# Patient Record
Sex: Male | Born: 2000 | Race: White | Hispanic: No | Marital: Single | Smoking: Never smoker
Health system: Southern US, Community
[De-identification: ages and names within clinical notes are randomized; demographics above are authoritative.]

## PROBLEM LIST (undated history)

## (undated) DIAGNOSIS — Z9109 Other allergy status, other than to drugs and biological substances: Secondary | ICD-10-CM

## (undated) DIAGNOSIS — J45909 Unspecified asthma, uncomplicated: Secondary | ICD-10-CM

## (undated) DIAGNOSIS — F419 Anxiety disorder, unspecified: Secondary | ICD-10-CM

---
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
## 2020-01-28 ENCOUNTER — Emergency Department (HOSPITAL_COMMUNITY)
Admission: EM | Admit: 2020-01-28 | Discharge: 2020-01-28 | Disposition: A | Discharge status: Home / Self Care | Payer: Medicaid Other | Attending: General Surgery | Admitting: General Surgery

## 2020-01-28 ENCOUNTER — Emergency Department (HOSPITAL_COMMUNITY): Payer: Medicaid Other | Admitting: Certified Registered"

## 2020-01-28 ENCOUNTER — Emergency Department (HOSPITAL_COMMUNITY): Payer: Medicaid Other

## 2020-01-28 ENCOUNTER — Encounter (HOSPITAL_COMMUNITY)
Admission: EM | Disposition: A | Discharge status: Home / Self Care | Payer: Self-pay | Source: Home / Self Care | Attending: Emergency Medicine

## 2020-01-28 DIAGNOSIS — W3400XA Accidental discharge from unspecified firearms or gun, initial encounter: Secondary | ICD-10-CM | POA: Diagnosis not present

## 2020-01-28 DIAGNOSIS — S0993XA Unspecified injury of face, initial encounter: Secondary | ICD-10-CM | POA: Diagnosis present

## 2020-01-28 DIAGNOSIS — S0242XB Fracture of alveolus of maxilla, initial encounter for open fracture: Secondary | ICD-10-CM | POA: Diagnosis not present

## 2020-01-28 DIAGNOSIS — S1194XA Puncture wound with foreign body of unspecified part of neck, initial encounter: Secondary | ICD-10-CM | POA: Diagnosis not present

## 2020-01-28 DIAGNOSIS — Z930 Tracheostomy status: Secondary | ICD-10-CM

## 2020-01-28 DIAGNOSIS — Z23 Encounter for immunization: Secondary | ICD-10-CM | POA: Diagnosis not present

## 2020-01-28 DIAGNOSIS — Z20822 Contact with and (suspected) exposure to covid-19: Secondary | ICD-10-CM | POA: Diagnosis not present

## 2020-01-28 DIAGNOSIS — T1490XA Injury, unspecified, initial encounter: Secondary | ICD-10-CM

## 2020-01-28 DIAGNOSIS — S022XXB Fracture of nasal bones, initial encounter for open fracture: Secondary | ICD-10-CM | POA: Insufficient documentation

## 2020-01-28 DIAGNOSIS — Z68.41 Body mass index (BMI) pediatric, greater than or equal to 95th percentile for age: Secondary | ICD-10-CM | POA: Insufficient documentation

## 2020-01-28 DIAGNOSIS — Y999 Unspecified external cause status: Secondary | ICD-10-CM | POA: Diagnosis not present

## 2020-01-28 DIAGNOSIS — S0266XB Fracture of symphysis of mandible, initial encounter for open fracture: Secondary | ICD-10-CM | POA: Diagnosis not present

## 2020-01-28 DIAGNOSIS — E669 Obesity, unspecified: Secondary | ICD-10-CM | POA: Diagnosis not present

## 2020-01-28 HISTORY — PX: TRACHEOSTOMY TUBE PLACEMENT: SHX814

## 2020-01-28 LAB — CBC
HCT: 44.8 % (ref 39.0–52.0)
Hemoglobin: 14.2 g/dL (ref 13.0–17.0)
MCH: 28.6 pg (ref 26.0–34.0)
MCHC: 31.7 g/dL (ref 30.0–36.0)
MCV: 90.1 fL (ref 80.0–100.0)
Platelets: 329 10*3/uL (ref 150–400)
RBC: 4.97 MIL/uL (ref 4.22–5.81)
RDW: 12.9 % (ref 11.5–15.5)
WBC: 20.6 10*3/uL — ABNORMAL HIGH (ref 4.0–10.5)
nRBC: 0 % (ref 0.0–0.2)

## 2020-01-28 LAB — I-STAT CHEM 8, ED
BUN: 14 mg/dL (ref 6–20)
Calcium, Ion: 1.04 mmol/L — ABNORMAL LOW (ref 1.15–1.40)
Chloride: 102 mmol/L (ref 98–111)
Creatinine, Ser: 0.8 mg/dL (ref 0.61–1.24)
Glucose, Bld: 163 mg/dL — ABNORMAL HIGH (ref 70–99)
HCT: 44 % (ref 39.0–52.0)
Hemoglobin: 15 g/dL (ref 13.0–17.0)
Potassium: 3.2 mmol/L — ABNORMAL LOW (ref 3.5–5.1)
Sodium: 138 mmol/L (ref 135–145)
TCO2: 22 mmol/L (ref 22–32)

## 2020-01-28 LAB — TYPE AND SCREEN
ABO/RH(D): A POS
Antibody Screen: NEGATIVE

## 2020-01-28 LAB — LACTIC ACID, PLASMA: Lactic Acid, Venous: 1.8 mmol/L (ref 0.5–1.9)

## 2020-01-28 LAB — COMPREHENSIVE METABOLIC PANEL
ALT: 34 U/L (ref 0–44)
AST: 28 U/L (ref 15–41)
Albumin: 4.5 g/dL (ref 3.5–5.0)
Alkaline Phosphatase: 83 U/L (ref 38–126)
Anion gap: 14 (ref 5–15)
BUN: 12 mg/dL (ref 6–20)
CO2: 22 mmol/L (ref 22–32)
Calcium: 9.2 mg/dL (ref 8.9–10.3)
Chloride: 99 mmol/L (ref 98–111)
Creatinine, Ser: 0.87 mg/dL (ref 0.61–1.24)
GFR calc Af Amer: >60 mL/min (ref >60)
GFR calc non Af Amer: >60 mL/min (ref >60)
Glucose, Bld: 167 mg/dL — ABNORMAL HIGH (ref 70–99)
Potassium: 3.3 mmol/L — ABNORMAL LOW (ref 3.5–5.1)
Sodium: 135 mmol/L (ref 135–145)
Total Bilirubin: 1 mg/dL (ref 0.3–1.2)
Total Protein: 7 g/dL (ref 6.5–8.1)

## 2020-01-28 LAB — PROTIME-INR
INR: 1 (ref 0.8–1.2)
Prothrombin Time: 13 seconds (ref 11.4–15.2)

## 2020-01-28 LAB — SARS CORONAVIRUS 2 BY RT PCR (HOSPITAL ORDER, PERFORMED IN ~~LOC~~ HOSPITAL LAB): SARS Coronavirus 2: NEGATIVE

## 2020-01-28 LAB — ETHANOL: Alcohol, Ethyl (B): <10 mg/dL (ref <10)

## 2020-01-28 SURGERY — CREATION, TRACHEOSTOMY
Anesthesia: General

## 2020-01-28 MED ORDER — FENTANYL CITRATE (PF) 250 MCG/5ML IJ SOLN
INTRAMUSCULAR | Status: DC | PRN
  Administered 2020-01-28 (×2): 100 ug via INTRAVENOUS
  Administered 2020-01-28: 150 ug via INTRAVENOUS

## 2020-01-28 MED ORDER — FENTANYL CITRATE (PF) 250 MCG/5ML IJ SOLN
INTRAMUSCULAR | Status: AC
  Filled 2020-01-28: qty 5

## 2020-01-28 MED ORDER — PROPOFOL 500 MG/50ML IV EMUL
INTRAVENOUS | Status: DC | PRN
  Administered 2020-01-28: 100 ug/kg/min via INTRAVENOUS

## 2020-01-28 MED ORDER — IOHEXOL 300 MG/ML  SOLN
75.0000 mL | Freq: Once | INTRAMUSCULAR | Status: AC | PRN
  Administered 2020-01-28: 75 mL via INTRAVENOUS

## 2020-01-28 MED ORDER — 0.9 % SODIUM CHLORIDE (POUR BTL) OPTIME
TOPICAL | Status: DC | PRN
  Administered 2020-01-28: 1000 mL

## 2020-01-28 MED ORDER — PROPOFOL 10 MG/ML IV BOLUS
INTRAVENOUS | Status: AC
  Filled 2020-01-28: qty 20

## 2020-01-28 MED ORDER — SUCCINYLCHOLINE CHLORIDE 20 MG/ML IJ SOLN
INTRAMUSCULAR | Status: DC | PRN
  Administered 2020-01-28: 200 mg via INTRAVENOUS

## 2020-01-28 MED ORDER — FENTANYL 2500MCG IN NS 250ML (10MCG/ML) PREMIX INFUSION
INTRAVENOUS | Status: AC
  Filled 2020-01-28: qty 250

## 2020-01-28 MED ORDER — LACTATED RINGERS IV BOLUS
1000.0000 mL | Freq: Once | INTRAVENOUS | Status: AC
  Administered 2020-01-28: 1000 mL via INTRAVENOUS

## 2020-01-28 MED ORDER — MIDAZOLAM HCL 2 MG/2ML IJ SOLN
INTRAMUSCULAR | Status: AC
  Filled 2020-01-28: qty 2

## 2020-01-28 MED ORDER — ALBUMIN HUMAN 5 % IV SOLN: INTRAVENOUS | Status: DC | PRN

## 2020-01-28 MED ORDER — PROPOFOL 10 MG/ML IV BOLUS
INTRAVENOUS | Status: DC | PRN
  Administered 2020-01-28: 30 mg via INTRAVENOUS
  Administered 2020-01-28: 200 mg via INTRAVENOUS

## 2020-01-28 MED ORDER — ONDANSETRON HCL 4 MG/2ML IJ SOLN: 4.0000 mg | Freq: Once | INTRAMUSCULAR | Status: DC | PRN

## 2020-01-28 MED ORDER — HYDROMORPHONE HCL 1 MG/ML IJ SOLN: 0.2500 mg | INTRAMUSCULAR | Status: DC | PRN

## 2020-01-28 MED ORDER — CEFAZOLIN SODIUM-DEXTROSE 2-4 GM/100ML-% IV SOLN
2.0000 g | Freq: Once | INTRAVENOUS | Status: AC
  Administered 2020-01-28: 2 g via INTRAVENOUS

## 2020-01-28 MED ORDER — PROPOFOL 1000 MG/100ML IV EMUL
INTRAVENOUS | Status: AC
  Filled 2020-01-28: qty 100

## 2020-01-28 MED ORDER — LIDOCAINE HCL (CARDIAC) PF 100 MG/5ML IV SOSY
PREFILLED_SYRINGE | INTRAVENOUS | Status: DC | PRN
  Administered 2020-01-28: 100 mg via INTRATRACHEAL

## 2020-01-28 MED ORDER — KETAMINE HCL 50 MG/5ML IJ SOSY
1.0000 mg/kg | PREFILLED_SYRINGE | Freq: Once | INTRAMUSCULAR | Status: DC
  Filled 2020-01-28: qty 10

## 2020-01-28 MED ORDER — LACTATED RINGERS IV SOLN: INTRAVENOUS | Status: DC | PRN

## 2020-01-28 MED ORDER — TETANUS-DIPHTH-ACELL PERTUSSIS 5-2.5-18.5 LF-MCG/0.5 IM SUSP
0.5000 mL | Freq: Once | INTRAMUSCULAR | Status: AC
  Administered 2020-01-28: 0.5 mL via INTRAMUSCULAR

## 2020-01-28 MED ORDER — ROCURONIUM BROMIDE 100 MG/10ML IV SOLN
INTRAVENOUS | Status: DC | PRN
  Administered 2020-01-28: 40 mg via INTRAVENOUS
  Administered 2020-01-28: 60 mg via INTRAVENOUS
  Administered 2020-01-28: 40 mg via INTRAVENOUS

## 2020-01-28 MED ORDER — FENTANYL 2500MCG IN NS 250ML (10MCG/ML) PREMIX INFUSION
0.0000 ug/h | INTRAVENOUS | Status: DC
  Administered 2020-01-28: 50 ug/h via INTRAVENOUS

## 2020-01-28 MED ORDER — MIDAZOLAM HCL 2 MG/2ML IJ SOLN
INTRAMUSCULAR | Status: DC | PRN
  Administered 2020-01-28: 2 mg via INTRAVENOUS

## 2020-01-28 MED ORDER — CEFAZOLIN SODIUM-DEXTROSE 2-4 GM/100ML-% IV SOLN
INTRAVENOUS | Status: AC
  Filled 2020-01-28: qty 100

## 2020-01-28 MED ORDER — MEPERIDINE HCL 25 MG/ML IJ SOLN: 6.2500 mg | INTRAMUSCULAR | Status: DC | PRN

## 2020-01-28 MED ORDER — SODIUM CHLORIDE 0.9 % IV SOLN: INTRAVENOUS | Status: DC | PRN

## 2020-01-28 SURGICAL SUPPLY — 26 items
BLADE CLIPPER SURG (BLADE) ×3 IMPLANT
BLADE SURG 15 STRL LF DISP TIS (BLADE) ×1 IMPLANT
BLADE SURG 15 STRL SS (BLADE) ×3
BNDG CONFORM 3 STRL LF (GAUZE/BANDAGES/DRESSINGS) ×3 IMPLANT
CANISTER SUCT 3000ML PPV (MISCELLANEOUS) ×3 IMPLANT
DRAPE HALF SHEET 40X57 (DRAPES) ×3 IMPLANT
ELECT REM PT RETURN 9FT ADLT (ELECTROSURGICAL) ×3
ELECTRODE REM PT RTRN 9FT ADLT (ELECTROSURGICAL) ×1 IMPLANT
GAUZE 4X4 16PLY RFD (DISPOSABLE) ×3 IMPLANT
GLOVE BIO SURGEON STRL SZ7 (GLOVE) ×3 IMPLANT
GOWN STRL REUS W/ TWL LRG LVL3 (GOWN DISPOSABLE) ×1 IMPLANT
GOWN STRL REUS W/TWL LRG LVL3 (GOWN DISPOSABLE) ×3
HOLDER TRACH TUBE VELCRO 19.5 (MISCELLANEOUS) ×3 IMPLANT
KIT BASIN OR (CUSTOM PROCEDURE TRAY) ×3 IMPLANT
KIT TURNOVER KIT B (KITS) ×3 IMPLANT
PAD ARMBOARD 7.5X6 YLW CONV (MISCELLANEOUS) ×6 IMPLANT
PENCIL BUTTON HOLSTER BLD 10FT (ELECTRODE) ×3 IMPLANT
SUT ETHIBOND 0 36 GRN (SUTURE) ×6 IMPLANT
SUT VIC AB 3-0 SH 27 (SUTURE) ×3
SUT VIC AB 3-0 SH 27X BRD (SUTURE) ×1 IMPLANT
TUBE CONNECTING 12'X1/4 (SUCTIONS) ×1
TUBE CONNECTING 12X1/4 (SUCTIONS) ×2 IMPLANT
TUBE TRACH 8.0 EXL PROX  CUF (TUBING) ×2
TUBE TRACH 8.0 EXL PROX CUF (TUBING) ×1 IMPLANT
TUBE TRACH SHILEY 8 DIST CUF (TUBING) IMPLANT
WATER STERILE IRR 1000ML POUR (IV SOLUTION) ×3 IMPLANT

## 2020-01-28 NOTE — Anesthesia Procedure Notes (Signed)
Procedure Name: Intubation Date/Time: 01/28/2020 8:34 AM Performed by: Colon Flattery, CRNA Pre-anesthesia Checklist: Patient identified, Emergency Drugs available, Suction available, Patient being monitored and Timeout performed Patient Re-evaluated:Patient Re-evaluated prior to induction Oxygen Delivery Method: Circle system utilized Preoxygenation: Pre-oxygenation with 100% oxygen Induction Type: IV induction Laryngoscope Size: Glidescope and 4 Grade View: Grade I Tube size: 8.0 mm Number of attempts: 1 Placement Confirmation: ETT inserted through vocal cords under direct vision,  positive ETCO2 and breath sounds checked- equal and bilateral Tube secured with: Tape Dental Injury: Teeth and Oropharynx as per pre-operative assessment
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
## 2020-01-28 NOTE — Transfer of Care (Signed)
Immediate Anesthesia Transfer of Care Note  Patient: Bryan Patel  Procedure(s) Performed: TRACHEOSTOMY (N/A )  Patient Location: PACU  Anesthesia Type:General  Level of Consciousness: sedated and Patient remains intubated per anesthesia plan  Airway & Oxygen Therapy: Patient remains intubated per anesthesia plan and Patient placed on Ventilator (see vital sign flow sheet for setting)  Post-op Assessment: Report given to RN and Post -op Vital signs reviewed and stable  Post vital signs: Reviewed and stable  Last Vitals:  Vitals Value Taken Time  BP    Temp    Pulse 93 01/28/20 1036  Resp 16 01/28/20 1036  SpO2 100 % 01/28/20 1036  Vitals shown include unvalidated device data.  Last Pain:  Vitals:   01/28/20 0800  TempSrc: Temporal         Complications: No complications documented.
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
## 2020-01-28 NOTE — Anesthesia Postprocedure Evaluation (Signed)
Anesthesia Post Note  Patient: Bryan Patel  Procedure(s) Performed: TRACHEOSTOMY (N/A )     Patient location during evaluation: SICU Anesthesia Type: General Level of consciousness: sedated Pain management: pain level controlled Vital Signs Assessment: post-procedure vital signs reviewed and stable Respiratory status: patient remains intubated per anesthesia plan Cardiovascular status: stable Postop Assessment: no apparent nausea or vomiting Anesthetic complications: no   No complications documented.  Last Vitals:  Vitals:   01/28/20 1110 01/28/20 1130  BP: 127/77 129/71  Pulse: 92 90  Resp: 16 (!) 33  Temp:  36.4 C  SpO2: 100% 100%    Last Pain:  Vitals:   01/28/20 0800  TempSrc: Temporal                 Jolita Haefner DAVID

## 2020-01-29 ENCOUNTER — Encounter (HOSPITAL_COMMUNITY): Payer: Self-pay | Admitting: Otolaryngology

## 2020-01-30 NOTE — Progress Notes (Signed)
EMTALA form, Medical necessity form and consent to transfer form all being scanned in as paper forms were used.

## 2020-01-31 ENCOUNTER — Encounter (HOSPITAL_COMMUNITY): Payer: Self-pay | Admitting: Psychiatry
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
